# Patient Record
Sex: Female | Born: 1995 | Race: Black or African American | Hispanic: No | Marital: Single | Smoking: Never smoker
Health system: Southern US, Community
[De-identification: ages and names within clinical notes are randomized; demographics above are authoritative.]

## PROBLEM LIST (undated history)

## (undated) ENCOUNTER — Ambulatory Visit: Admission: EM | Payer: 59

---

## 2018-08-10 ENCOUNTER — Encounter: Payer: Self-pay | Admitting: Family Medicine

## 2018-08-10 ENCOUNTER — Ambulatory Visit: Payer: Self-pay | Admitting: Family Medicine

## 2018-08-10 VITALS — BP 100/72 | HR 98 | Temp 98.3°F | Resp 16 | Wt 185.6 lb

## 2018-08-10 DIAGNOSIS — H6690 Otitis media, unspecified, unspecified ear: Secondary | ICD-10-CM

## 2018-08-10 MED ORDER — DOXYCYCLINE HYCLATE 100 MG PO TABS: 100.0000 mg | ORAL_TABLET | Freq: Two times a day (BID) | ORAL | 0 refills | Status: AC

## 2018-08-10 NOTE — Progress Notes (Signed)
Deborah Abbott is a 23 y.o. female who presents today with 3 days of sore throat, neck pain, and headache with body aches. She denies any known sick contacts with similar symptoms. She has not attempted to treat this condition with any medications up to this point. She reports that she is overall healthy and not managed for any chronic medical conditions.   Review of Systems  Constitutional: Positive for chills and fever. Negative for malaise/fatigue.  HENT: Positive for sore throat. Negative for congestion, ear discharge, ear pain and sinus pain.   Eyes: Negative.   Respiratory: Negative for cough, sputum production and shortness of breath.   Cardiovascular: Negative.  Negative for chest pain.  Gastrointestinal: Negative for abdominal pain, diarrhea, nausea and vomiting.  Genitourinary: Negative for dysuria, frequency, hematuria and urgency.  Musculoskeletal: Negative for myalgias.  Skin: Negative.   Neurological: Negative for headaches.  Endo/Heme/Allergies: Negative.   Psychiatric/Behavioral: Negative.     Deborah Abbott has a current medication list which includes the following prescription(s): levonorgestrel-ethinyl estradiol and doxycycline. Also is allergic to cantaloupe (diagnostic) and tomato.  Deborah Abbott  has no past medical history on file. Also  has no past surgical history on file.    O: Vitals:   08/10/18 1026  BP: 100/72  Pulse: 98  Resp: 16  Temp: 98.3 F (36.8 C)  SpO2: 97%     Physical Exam Vitals signs reviewed.  Constitutional:      Appearance: She is well-developed. She is not toxic-appearing.  HENT:     Head: Normocephalic.     Right Ear: Hearing, ear canal and external ear normal. Tympanic membrane is erythematous.     Left Ear: Hearing, ear canal and external ear normal. A middle ear effusion is present. Tympanic membrane is erythematous and bulging.     Nose: Congestion and rhinorrhea present.     Right Sinus: No maxillary sinus tenderness or frontal sinus  tenderness.     Left Sinus: No maxillary sinus tenderness or frontal sinus tenderness.     Mouth/Throat:     Pharynx: Uvula midline. Posterior oropharyngeal erythema present. No pharyngeal swelling, oropharyngeal exudate or uvula swelling.     Tonsils: No tonsillar exudate or tonsillar abscesses. Swelling: 2+ on the right. 2+ on the left.  Neck:     Musculoskeletal: Normal range of motion and neck supple.  Cardiovascular:     Rate and Rhythm: Normal rate and regular rhythm.     Pulses: Normal pulses.     Heart sounds: Normal heart sounds.  Pulmonary:     Effort: Pulmonary effort is normal.     Breath sounds: Normal breath sounds.  Abdominal:     General: Bowel sounds are normal.     Palpations: Abdomen is soft.  Musculoskeletal: Normal range of motion.  Lymphadenopathy:     Head:     Right side of head: Tonsillar adenopathy present. No submental or submandibular adenopathy.     Left side of head: Tonsillar adenopathy present. No submental or submandibular adenopathy.     Cervical: Cervical adenopathy present.     Right cervical: Superficial cervical adenopathy present.     Left cervical: Superficial cervical adenopathy present.  Neurological:     Mental Status: She is alert and oriented to person, place, and time.    A: 1. Acute otitis media, unspecified otitis media type     P: 1. Acute otitis media, unspecified otitis media type Will initiate treatment for otitis based on PE. Reported pen allergy. - doxycycline (VIBRA-TABS)  100 MG tablet; Take 1 tablet (100 mg total) by mouth 2 (two) times daily for 7 days.  Other orders - levonorgestrel-ethinyl estradiol (LARISSIA) 0.1-20 MG-MCG tablet; Take 1 tablet by mouth daily.   Discussed with patient exam findings, suspected diagnosis etiology and  reviewed recommended treatment plan and follow up, including complications and indications for urgent medical follow up and evaluation. Medications including use and indications reviewed  with patient. Patient provided relevant patient education on diagnosis and/or relevant related condition that were discussed and reviewed with patient at discharge. Patient verbalized understanding of information provided and agrees with plan of care (POC), all questions answered.

## 2018-08-10 NOTE — Patient Instructions (Signed)

## 2020-11-18 ENCOUNTER — Encounter (HOSPITAL_COMMUNITY): Payer: Self-pay

## 2020-11-18 ENCOUNTER — Emergency Department (HOSPITAL_COMMUNITY): Payer: 59

## 2020-11-18 ENCOUNTER — Emergency Department (HOSPITAL_COMMUNITY)
Admission: EM | Admit: 2020-11-18 | Discharge: 2020-11-18 | Disposition: A | Discharge status: Home / Self Care | Payer: 59 | Attending: Emergency Medicine | Admitting: Emergency Medicine

## 2020-11-18 DIAGNOSIS — R072 Precordial pain: Secondary | ICD-10-CM | POA: Diagnosis present

## 2020-11-18 DIAGNOSIS — U071 COVID-19: Secondary | ICD-10-CM | POA: Insufficient documentation

## 2020-11-18 DIAGNOSIS — R079 Chest pain, unspecified: Secondary | ICD-10-CM

## 2020-11-18 LAB — CBC WITH DIFFERENTIAL/PLATELET
Abs Immature Granulocytes: 0.01 10*3/uL (ref 0.00–0.07)
Basophils Absolute: 0 10*3/uL (ref 0.0–0.1)
Basophils Relative: 1 %
Eosinophils Absolute: 0.1 10*3/uL (ref 0.0–0.5)
Eosinophils Relative: 3 %
HCT: 39.7 % (ref 36.0–46.0)
Hemoglobin: 12.7 g/dL (ref 12.0–15.0)
Immature Granulocytes: 0 %
Lymphocytes Relative: 28 %
Lymphs Abs: 1.4 10*3/uL (ref 0.7–4.0)
MCH: 30.8 pg (ref 26.0–34.0)
MCHC: 32 g/dL (ref 30.0–36.0)
MCV: 96.1 fL (ref 80.0–100.0)
Monocytes Absolute: 0.4 10*3/uL (ref 0.1–1.0)
Monocytes Relative: 9 %
Neutro Abs: 2.9 10*3/uL (ref 1.7–7.7)
Neutrophils Relative %: 59 %
Platelets: 385 10*3/uL (ref 150–400)
RBC: 4.13 MIL/uL (ref 3.87–5.11)
RDW: 12.9 % (ref 11.5–15.5)
WBC: 4.8 10*3/uL (ref 4.0–10.5)
nRBC: 0 % (ref 0.0–0.2)

## 2020-11-18 LAB — COMPREHENSIVE METABOLIC PANEL
ALT: 21 U/L (ref 0–44)
AST: 20 U/L (ref 15–41)
Albumin: 3.3 g/dL — ABNORMAL LOW (ref 3.5–5.0)
Alkaline Phosphatase: 52 U/L (ref 38–126)
Anion gap: 5 (ref 5–15)
BUN: 15 mg/dL (ref 6–20)
CO2: 26 mmol/L (ref 22–32)
Calcium: 8.7 mg/dL — ABNORMAL LOW (ref 8.9–10.3)
Chloride: 108 mmol/L (ref 98–111)
Creatinine, Ser: 0.68 mg/dL (ref 0.44–1.00)
GFR, Estimated: >60 mL/min (ref >60)
Glucose, Bld: 97 mg/dL (ref 70–99)
Potassium: 4.1 mmol/L (ref 3.5–5.1)
Sodium: 139 mmol/L (ref 135–145)
Total Bilirubin: 0.3 mg/dL (ref 0.3–1.2)
Total Protein: 6.3 g/dL — ABNORMAL LOW (ref 6.5–8.1)

## 2020-11-18 LAB — I-STAT BETA HCG BLOOD, ED (MC, WL, AP ONLY): I-stat hCG, quantitative: <5 m[IU]/mL (ref <5)

## 2020-11-18 LAB — D-DIMER, QUANTITATIVE: D-Dimer, Quant: 0.46 ug/mL-FEU (ref 0.00–0.50)

## 2020-11-18 LAB — SARS CORONAVIRUS 2 (TAT 6-24 HRS): SARS Coronavirus 2: POSITIVE — AB

## 2020-11-18 MED ORDER — PREDNISONE 20 MG PO TABS
60.0000 mg | ORAL_TABLET | Freq: Once | ORAL | Status: AC
  Administered 2020-11-18: 60 mg via ORAL
  Filled 2020-11-18: qty 3

## 2020-11-18 MED ORDER — ACETAMINOPHEN 325 MG PO TABS
650.0000 mg | ORAL_TABLET | Freq: Once | ORAL | Status: AC
  Administered 2020-11-18: 650 mg via ORAL
  Filled 2020-11-18: qty 2

## 2020-11-18 MED ORDER — PREDNISONE 50 MG PO TABS: 50.0000 mg | ORAL_TABLET | Freq: Every day | ORAL | 0 refills | Status: AC

## 2020-11-18 MED ORDER — ALBUTEROL SULFATE HFA 108 (90 BASE) MCG/ACT IN AERS
2.0000 | INHALATION_SPRAY | Freq: Once | RESPIRATORY_TRACT | Status: AC
  Administered 2020-11-18: 2 via RESPIRATORY_TRACT
  Filled 2020-11-18: qty 6.7

## 2020-11-18 NOTE — Discharge Instructions (Addendum)
Take acetaminophen and/or ibuprofen as needed for pain.  Use the inhaler, 2 puffs every 4 hours, as needed.  Return if you develop any shortness of breath or worsening of symptoms.  You can look up your COVID-19 PCR test result on MyChart.

## 2020-11-18 NOTE — ED Provider Notes (Signed)
[facility]-EMERGENCY DEPT Provider Note   CSN: [id number] Arrival date & time: 11/18/20  0320     History Chief Complaint  Patient presents with  . Chest Pain    Deborah Abbott is a 25 y.o. female.  The history is provided by the patient.  Chest Pain She woke up today with a sharp pain in her midsternal area and some difficulty breathing.  Pain was worse when she took deep breath.  She denies fever, chills, sweats today.  She denies any cough.  Yesterday, she woke up with sweats and generalized body aches and took a home test for COVID-19 which was positive.  She denies nausea, vomiting, diarrhea.  She did have a scratchy throat yesterday which has resolved.  Her boyfriend had been sick with similar symptoms, but never was tested for COVID.  Patient has received the COVID vaccine series including a booster.  She is a non-smoker.  She is on birth control pills.   History reviewed. No pertinent past medical history.  There are no problems to display for this patient.   History reviewed. No pertinent surgical history.   OB History   No obstetric history on file.     History reviewed. No pertinent family history.  Social History   Tobacco Use  . Smoking status: Never Smoker  . Smokeless tobacco: Never Used    Home Medications Prior to Admission medications   Medication Sig Start Date End Date Taking? Authorizing Provider  levonorgestrel-ethinyl estradiol (LARISSIA) 0.1-20 MG-MCG tablet Take 1 tablet by mouth daily.    [provider]    Allergies    Cefdinir, Cantaloupe (diagnostic), and Tomato  Review of Systems   Review of Systems  Cardiovascular: Positive for chest pain.  All other systems reviewed and are negative.   Physical Exam Updated Vital Signs BP (!) 147/95 (BP Location: Left Arm)   Pulse 95   Temp 98.4 F (36.9 C)   Resp 16   Ht 5\' 8"  (1.727 m)   Wt 90.7 kg   LMP 11/14/2020   SpO2 100%   BMI 30.41 kg/m    Physical Exam Vitals and nursing note reviewed.   25 year old female, resting comfortably and in no acute distress. Vital signs are significant for mildly elevated blood pressure. Oxygen saturation is 100%, which is normal. Head is normocephalic and atraumatic. PERRLA, EOMI. Oropharynx is clear. Neck is nontender and supple without adenopathy or JVD. Back is nontender and there is no CVA tenderness. Lungs are clear without rales, wheezes, or rhonchi. Chest is nontender. Heart has regular rate and rhythm without murmur. Abdomen is soft, flat, nontender without masses or hepatosplenomegaly and peristalsis is normoactive. Extremities have no cyanosis or edema, full range of motion is present. Skin is warm and dry without rash. Neurologic: Mental status is normal, cranial nerves are intact, there are no motor or sensory deficits.  ED Results / Procedures / Treatments   Labs (all labs ordered are listed, but only abnormal results are displayed) Labs Reviewed  COMPREHENSIVE METABOLIC PANEL - Abnormal; Notable for the following components:      Result Value   Calcium 8.7 (*)    Total Protein 6.3 (*)    Albumin 3.3 (*)    All other components within normal limits  SARS CORONAVIRUS 2 (TAT 6-24 HRS)  CBC WITH DIFFERENTIAL/PLATELET  D-DIMER, QUANTITATIVE  I-STAT BETA HCG BLOOD, ED ([facility], [facility], [facility] ONLY)    EKG EKG Interpretation  Date/Time:  Thursday Nov 18 2020 03:33:56 EDT Ventricular Rate:  87 PR Interval:  146 QRS Duration: 89 QT Interval:  347 QTC Calculation: 418 R Axis:   73 Text Interpretation: Sinus rhythm Normal ECG No old tracing to compare Confirmed by Dione Booze ([id number]) on 11/18/2020 3:44:37 AM   Radiology DG Chest Port 1 View  Result Date: 11/18/2020 CLINICAL DATA:  Chest pain.  Shortness of breath.  COVID positive. EXAM: PORTABLE CHEST 1 VIEW COMPARISON:  No prior. FINDINGS: Mediastinum and hilar structures normal. Heart size normal. No focal infiltrate. No pleural  effusion or pneumothorax. IMPRESSION: No acute abnormality. Electronically Signed   By: Maisie Fus  Register   On: 11/18/2020 05:58    Procedures Procedures   Medications Ordered in ED Medications  predniSONE (DELTASONE) tablet 60 mg (has no administration in time range)  acetaminophen (TYLENOL) tablet 650 mg (650 mg Oral Given 11/18/20 0613)  albuterol (VENTOLIN HFA) 108 (90 Base) MCG/ACT inhaler 2 puff (2 puffs Inhalation Given 11/18/20 7939)    ED Course  I have reviewed the triage vital signs and the nursing notes.  Pertinent labs & imaging results that were available during my care of the patient were reviewed by me and considered in my medical decision making (see chart for details).   MDM Rules/Calculators/A&P                         Chest pain and shortness of breath in the setting of a positive test for COVID-19.  She is also on birth control pills which raises concern for possible pulmonary embolism.  Will check chest x-ray and D-dimer.  ECG is normal.  We will need to check chest x-ray.  Old records are reviewed, and she has no relevant past visits.  Chest x-ray shows no evidence of pneumonia.  WBC is normal with normal differential.  Metabolic panel is unremarkable.  D-dimer is normal.  Patient got significant relief with acetaminophen and albuterol.  She is given a dose of prednisone and discharged with prescription for prednisone.  She is advised to use her inhaler every 4 hours as needed, look up the results of her respiratory pathogen panel on MyChart.  Return precautions discussed.  Final Clinical Impression(s) / ED Diagnoses Final diagnoses:  Nonspecific chest pain  COVID-19 virus infection    Rx / DC Orders ED Discharge Orders         Ordered    predniSONE (DELTASONE) 50 MG tablet  Daily        11/18/20 0728           Dione Booze, MD 11/18/20 [phone]

## 2020-11-18 NOTE — ED Triage Notes (Signed)
Pt arrived via walk in, c/o cont. Left sided chest pain and SOB. States had positive home covid test yesterday.

## 2021-06-21 ENCOUNTER — Encounter: Payer: Self-pay | Admitting: Emergency Medicine

## 2021-06-21 ENCOUNTER — Emergency Department (INDEPENDENT_AMBULATORY_CARE_PROVIDER_SITE_OTHER): Payer: 59

## 2021-06-21 ENCOUNTER — Other Ambulatory Visit: Payer: Self-pay

## 2021-06-21 ENCOUNTER — Emergency Department
Admission: EM | Admit: 2021-06-21 | Discharge: 2021-06-21 | Disposition: A | Discharge status: Home / Self Care | Payer: 59 | Source: Home / Self Care | Attending: Family Medicine | Admitting: Family Medicine

## 2021-06-21 DIAGNOSIS — M79672 Pain in left foot: Secondary | ICD-10-CM | POA: Diagnosis not present

## 2021-06-21 DIAGNOSIS — M25572 Pain in left ankle and joints of left foot: Secondary | ICD-10-CM

## 2021-06-21 DIAGNOSIS — M76822 Posterior tibial tendinitis, left leg: Secondary | ICD-10-CM | POA: Diagnosis not present

## 2021-06-21 MED ORDER — ACETAMINOPHEN 325 MG PO TABS
650.0000 mg | ORAL_TABLET | Freq: Once | ORAL | Status: AC
  Administered 2021-06-21: 10:00:00 650 mg via ORAL

## 2021-06-21 NOTE — Discharge Instructions (Signed)
Apply ice pack for 20 to 30 minutes, 3 to 4 times daily  Continue until pain and swelling decrease.  May take Ibuprofen 200mg , 4 tabs every 8 hours with food.  Begin range of motion and stretching exercises as tolerated.  Obtain well-fitting orthotic arch supports for both shoes.

## 2021-06-21 NOTE — ED Triage Notes (Signed)
Left ankle & foot pain x 1 week  Woke up w/ pain  Hurts to walk on it  Pain is to the sole of her left foot & radiates to her ankle  Pain w/ walking and standing - worse in the am Ibuprofen 400mg  daily - none today

## 2021-06-21 NOTE — ED Provider Notes (Signed)
[facility]    CSN: [id number] Arrival date & time: 06/21/21  0865      History   Chief Complaint Chief Complaint  Patient presents with   Ankle Pain    Left    Foot Pain    Left     HPI Deborah Abbott is a 25 y.o. female.   Patient complains of onset of pain in her left foot/ankle one week ago, worse in the mornings.  She has pain with walking and standing.  Ibuprofen 400mg  is not very helpful.  She recalls no injury but admits that she has been exercising in a gym.  The history is provided by the patient.  Ankle Pain Location:  Ankle and foot Time since incident:  1 week Injury: no   Ankle location:  L ankle Foot location:  L foot Pain details:    Quality:  Aching   Radiates to:  Does not radiate   Severity:  Moderate   Onset quality:  Sudden   Duration:  1 week   Timing:  Constant   Progression:  Unchanged Chronicity:  New Prior injury to area:  No Relieved by:  Elevation Worsened by:  Bearing weight Ineffective treatments:  NSAIDs Associated symptoms: no decreased ROM, no muscle weakness, no numbness, no stiffness, no swelling and no tingling   Risk factors: obesity    History reviewed. No pertinent past medical history.  There are no problems to display for this patient.   History reviewed. No pertinent surgical history.  OB History   No obstetric history on file.      Home Medications    Prior to Admission medications   Medication Sig Start Date End Date Taking? Authorizing Provider  levonorgestrel-ethinyl estradiol (VIENVA) 0.1-20 MG-MCG tablet Take 1 tablet by mouth daily.   Yes [provider]  levonorgestrel-ethinyl estradiol (ALESSE) 0.1-20 MG-MCG tablet Take 1 tablet by mouth daily. Patient not taking: Reported on 06/21/2021    [provider]  predniSONE (DELTASONE) 50 MG tablet Take 1 tablet (50 mg total) by mouth daily. Patient not taking: Reported on 06/21/2021 11/18/20   11/20/20, MD    Family  History Family History  Problem Relation Age of Onset   Hypertension Mother    Hypertension Father     Social History Social History   Tobacco Use   Smoking status: Never    Passive exposure: Never   Smokeless tobacco: Never  Vaping Use   Vaping Use: Never used  Substance Use Topics   Alcohol use: Yes    Alcohol/week: 2.0 standard drinks    Types: 2 Standard drinks or equivalent per week   Drug use: Never     Allergies   Cefdinir, Cantaloupe (diagnostic), and Tomato   Review of Systems Review of Systems  Musculoskeletal:  Negative for stiffness.       Left ankle and foot pain   All other systems reviewed and are negative.   Physical Exam Triage Vital Signs ED Triage Vitals  Enc Vitals Group     BP 06/21/21 1015 122/85     Pulse Rate 06/21/21 1015 74     Resp 06/21/21 1015 15     Temp 06/21/21 1015 99.1 F (37.3 C)     Temp Source 06/21/21 1015 Oral     SpO2 06/21/21 1015 99 %     Weight 06/21/21 1018 205 lb (93 kg)     Height 06/21/21 1018 5\' 8"  (1.727 m)     Head Circumference --  Peak Flow --      Pain Score 06/21/21 1017 5     Pain Loc --      Pain Edu? --      Excl. in GC? --    No data found.  Updated Vital Signs BP 122/85 (BP Location: Left Arm)    Pulse 74    Temp 99.1 F (37.3 C) (Oral)    Resp 15    Ht 5\' 8"  (1.727 m)    Wt 93 kg    LMP 06/07/2021 (Approximate)    SpO2 99%    BMI 31.17 kg/m   Visual Acuity Right Eye Distance:   Left Eye Distance:   Bilateral Distance:    Right Eye Near:   Left Eye Near:    Bilateral Near:     Physical Exam Vitals and nursing note reviewed.  Constitutional:      General: She is not in acute distress. HENT:     Head: Normocephalic.  Eyes:     Pupils: Pupils are equal, round, and reactive to light.  Cardiovascular:     Rate and Rhythm: Normal rate.  Pulmonary:     Effort: Pulmonary effort is normal.  Musculoskeletal:     Left foot: Normal range of motion. Tenderness present. No swelling,  bony tenderness or crepitus.       Feet:     Comments: Left foot has tenderness to palpation over posterior tibial tendon extending into arch.  Pain elicited by resisted plantar flexion and resisted inversion of the ankle.   Skin:    General: Skin is warm and dry.  Neurological:     Mental Status: She is alert and oriented to person, place, and time.     UC Treatments / Results  Labs (all labs ordered are listed, but only abnormal results are displayed) Labs Reviewed - No data to display  EKG   Radiology DG Ankle Complete Left  Result Date: 06/21/2021 CLINICAL DATA:  25 year old female with pain when walking and standing for 1 week. No known injury. EXAM: LEFT ANKLE COMPLETE - 3+ VIEW COMPARISON:  Left foot series today reported separately. FINDINGS: Bone mineralization is within normal limits. There is no evidence of fracture, dislocation, or joint effusion. There is no evidence of arthropathy or other focal bone abnormality. Soft tissues are unremarkable. IMPRESSION: Negative. Electronically Signed   By: 22 M.D.   On: 06/21/2021 10:55   DG Foot Complete Left  Result Date: 06/21/2021 CLINICAL DATA:  25 year old female with pain when walking and standing for 1 week. No known injury. EXAM: LEFT FOOT - COMPLETE 3+ VIEW COMPARISON:  None. FINDINGS: Bone mineralization is within normal limits. There is no evidence of fracture or dislocation. There is no evidence of arthropathy or other focal bone abnormality. Soft tissues are unremarkable. IMPRESSION: Negative. Electronically Signed   By: 22 M.D.   On: 06/21/2021 10:54    Procedures Procedures (including critical care time)  Medications Ordered in UC Medications  acetaminophen (TYLENOL) tablet 650 mg (650 mg Oral Given 06/21/21 1025)    Initial Impression / Assessment and Plan / UC Course  I have reviewed the triage vital signs and the nursing notes.  Pertinent labs & imaging results that were available during my care  of the patient were reviewed by me and considered in my medical decision making (see chart for details).    Followup with Dr. 06/23/21 ([facility]) if not improving about two weeks.  Given treatment instructions  with range of motion and stretching exercises.   Final Clinical Impressions(s) / UC Diagnoses   Final diagnoses:  Posterior tibial tendinitis of left lower extremity     Discharge Instructions      Apply ice pack for 20 to 30 minutes, 3 to 4 times daily  Continue until pain and swelling decrease.  May take Ibuprofen 200mg , 4 tabs every 8 hours with food.  Begin range of motion and stretching exercises as tolerated.  Obtain well-fitting orthotic arch supports for both shoes.    ED Prescriptions   None       , MD 06/23/21 1504

## 2021-10-03 IMAGING — DX DG CHEST 1V PORT
1 series · 1 of 1 positions shown · non-contrast
Comparison: No prior.

CLINICAL DATA: Chest pain.  Shortness of breath.  COVID positive.

EXAM:
PORTABLE CHEST 1 VIEW

[chest ap]
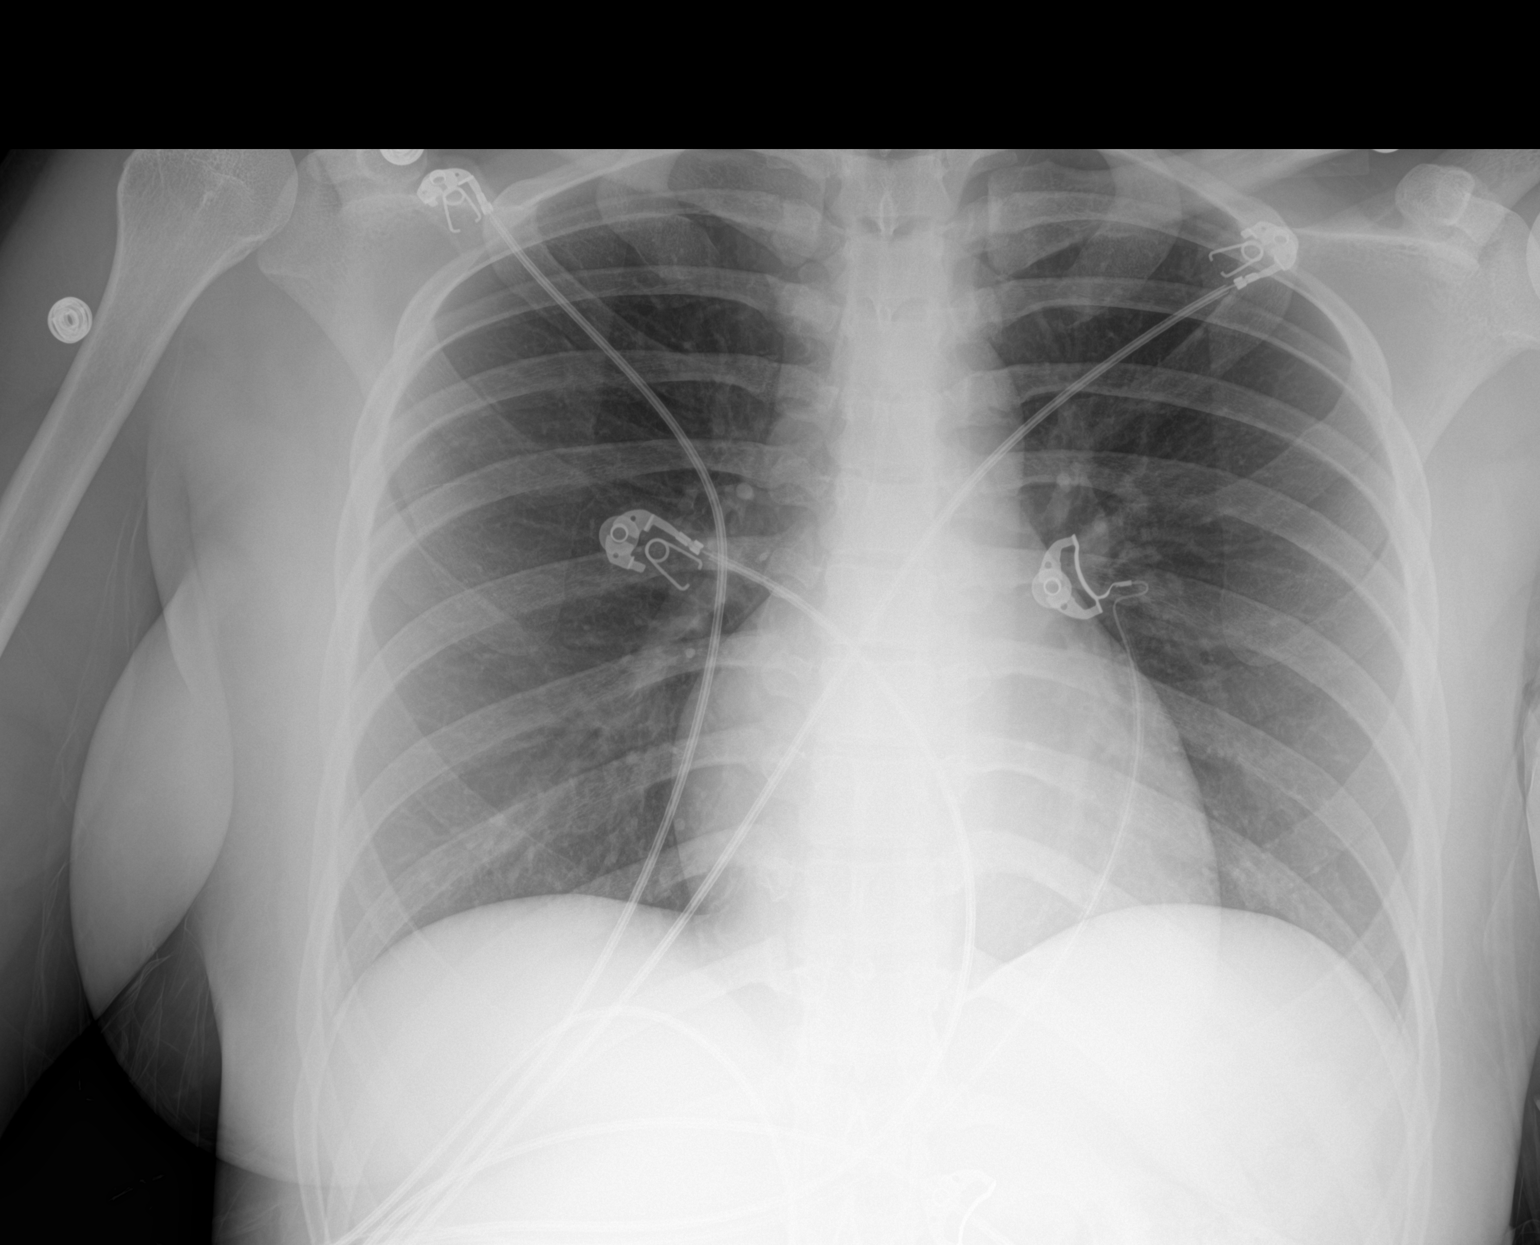

[1 of 1 positions shown; findings below may reference images not displayed]

FINDINGS: Mediastinum and hilar structures normal. Heart size normal. No focal
infiltrate. No pleural effusion or pneumothorax.
IMPRESSION: No acute abnormality.
